# Patient Record
Sex: Male | Born: 1970 | Race: White | Hispanic: No | Marital: Married | Smoking: Never smoker
Health system: Southern US, Community
[De-identification: ages and names within clinical notes are randomized; demographics above are authoritative.]

## PROBLEM LIST (undated history)

## (undated) DIAGNOSIS — M47816 Spondylosis without myelopathy or radiculopathy, lumbar region: Secondary | ICD-10-CM

## (undated) DIAGNOSIS — R112 Nausea with vomiting, unspecified: Secondary | ICD-10-CM

## (undated) DIAGNOSIS — M51369 Other intervertebral disc degeneration, lumbar region without mention of lumbar back pain or lower extremity pain: Secondary | ICD-10-CM

## (undated) DIAGNOSIS — R000 Tachycardia, unspecified: Secondary | ICD-10-CM

## (undated) DIAGNOSIS — M5126 Other intervertebral disc displacement, lumbar region: Secondary | ICD-10-CM

## (undated) DIAGNOSIS — Z9889 Other specified postprocedural states: Secondary | ICD-10-CM

## (undated) DIAGNOSIS — Z87442 Personal history of urinary calculi: Secondary | ICD-10-CM

## (undated) HISTORY — PX: KNEE ARTHROSCOPY: SHX127

## (undated) HISTORY — PX: CYSTOSCOPY: SHX5120

---

## 2013-02-05 ENCOUNTER — Emergency Department (HOSPITAL_BASED_OUTPATIENT_CLINIC_OR_DEPARTMENT_OTHER)
Admission: EM | Admit: 2013-02-05 | Discharge: 2013-02-05 | Disposition: A | Discharge status: Home / Self Care | Payer: PRIVATE HEALTH INSURANCE | Attending: Emergency Medicine | Admitting: Emergency Medicine

## 2013-02-05 ENCOUNTER — Encounter (HOSPITAL_BASED_OUTPATIENT_CLINIC_OR_DEPARTMENT_OTHER): Payer: Self-pay

## 2013-02-05 DIAGNOSIS — R531 Weakness: Secondary | ICD-10-CM

## 2013-02-05 DIAGNOSIS — R5381 Other malaise: Secondary | ICD-10-CM | POA: Insufficient documentation

## 2013-02-05 DIAGNOSIS — Z8679 Personal history of other diseases of the circulatory system: Secondary | ICD-10-CM | POA: Insufficient documentation

## 2013-02-05 HISTORY — DX: Tachycardia, unspecified: R00.0

## 2013-02-05 LAB — BASIC METABOLIC PANEL
CO2: 27 mEq/L (ref 19–32)
Calcium: 9.8 mg/dL (ref 8.4–10.5)
Chloride: 104 mEq/L (ref 96–112)
Glucose, Bld: 102 mg/dL — ABNORMAL HIGH (ref 70–99)
Sodium: 141 mEq/L (ref 135–145)

## 2013-02-05 LAB — CBC WITH DIFFERENTIAL/PLATELET
Eosinophils Relative: 3 % (ref 0–5)
HCT: 42.9 % (ref 39.0–52.0)
Lymphocytes Relative: 31 % (ref 12–46)
Lymphs Abs: 1.8 10*3/uL (ref 0.7–4.0)
MCV: 85.3 fL (ref 78.0–100.0)
Neutro Abs: 3.3 10*3/uL (ref 1.7–7.7)
Platelets: 179 10*3/uL (ref 150–400)
RBC: 5.03 MIL/uL (ref 4.22–5.81)
WBC: 5.8 10*3/uL (ref 4.0–10.5)

## 2013-02-05 NOTE — ED Provider Notes (Signed)
History     CSN: [id number]  Arrival date & time 02/05/13  1221   First MD Initiated Contact with Patient 02/05/13 1345      Chief Complaint  Patient presents with  . Weakness    (Consider location/radiation/quality/duration/timing/severity/associated sxs/prior treatment) Patient is a 42 y.o. male presenting with weakness.  Weakness   Pt reports he was sitting in church earlier today when he had an episode that is difficult for him to describe. He says it felt like he'd been hit by lightening. He describes it as similar to the sensation of jerking awake just as he is falling asleep, although he is pretty sure he did not fall asleep. Immediately afterward he felt jittery and nervous but denies any CP, SOB, loss of consciousness, nausea or diaphoresis. He walked out of the church and checked his pulse but says it was normal. He has felt generally weak since then but no further episodes of same. Reports a stressful and tiring day yesterday.   Past Medical History  Diagnosis Date  . Sinus tachycardia     Past Surgical History  Procedure Laterality Date  . Knee arthroscopy      History reviewed. No pertinent family history.  History  Substance Use Topics  . Smoking status: Never Smoker   . Smokeless tobacco: Never Used  . Alcohol Use: No      Review of Systems  Neurological: Positive for weakness.   All other systems reviewed and are negative except as noted in HPI.   Allergies  Review of patient's allergies indicates no known allergies.  Home Medications   Current Outpatient Rx  Name  Route  Sig  Dispense  Refill  . ibuprofen (ADVIL,MOTRIN) 200 MG tablet   Oral   Take 400 mg by mouth 2 (two) times daily as needed for pain.           BP 102/62  Pulse 74  Temp(Src) 98.7 F (37.1 C) (Oral)  Resp 14  Ht 6' (1.829 m)  Wt 205 lb (92.987 kg)  BMI 27.8 kg/m2  SpO2 100%  Physical Exam  Nursing note and vitals reviewed. Constitutional: He is oriented to  person, place, and time. He appears well-developed and well-nourished.  HENT:  Head: Normocephalic and atraumatic.  Eyes: EOM are normal. Pupils are equal, round, and reactive to light.  Neck: Normal range of motion. Neck supple.  Cardiovascular: Normal rate, normal heart sounds and intact distal pulses.   Pulmonary/Chest: Effort normal and breath sounds normal.  Abdominal: Bowel sounds are normal. He exhibits no distension. There is no tenderness.  Musculoskeletal: Normal range of motion. He exhibits no edema and no tenderness.  Neurological: He is alert and oriented to person, place, and time. He has normal strength. No cranial nerve deficit or sensory deficit.  Skin: Skin is warm and dry. No rash noted.  Psychiatric: He has a normal mood and affect.    ED Course  Procedures (including critical care time)  Labs Reviewed  BASIC METABOLIC PANEL - Abnormal; Notable for the following:    Glucose, Bld 102 (*)    All other components within normal limits  CBC WITH DIFFERENTIAL  TROPONIN I   No results found.   1. General weakness       MDM   Date: 02/05/2013  Rate: 83  Rhythm: normal sinus rhythm  QRS Axis: normal  Intervals: normal  ST/T Wave abnormalities: normal  Conduction Disutrbances:none  Narrative Interpretation: inferior Q waves are not pathologic  Old EKG Reviewed: none available   Labs and EKG negative. Pt asymptomatic in the ED. Does not appear to be acute cardiac or pulmonary process. Plan discharge with PCP followup.       Lidie Glade B. Bernette Mayers, MD 02/05/13 [phone]

## 2013-02-05 NOTE — ED Notes (Addendum)
Pt states that while sitting in church he felt a sensation as if a bolt of lightning hit him.  Pt states that he has hx of sinus tachycardia.  EKG being performed.

## 2013-03-27 ENCOUNTER — Encounter (HOSPITAL_BASED_OUTPATIENT_CLINIC_OR_DEPARTMENT_OTHER): Payer: Self-pay | Admitting: Emergency Medicine

## 2013-03-27 ENCOUNTER — Emergency Department (HOSPITAL_BASED_OUTPATIENT_CLINIC_OR_DEPARTMENT_OTHER)
Admission: EM | Admit: 2013-03-27 | Discharge: 2013-03-27 | Disposition: A | Discharge status: Home / Self Care | Payer: PRIVATE HEALTH INSURANCE | Attending: Emergency Medicine | Admitting: Emergency Medicine

## 2013-03-27 DIAGNOSIS — M5380 Other specified dorsopathies, site unspecified: Secondary | ICD-10-CM | POA: Insufficient documentation

## 2013-03-27 DIAGNOSIS — R109 Unspecified abdominal pain: Secondary | ICD-10-CM | POA: Insufficient documentation

## 2013-03-27 DIAGNOSIS — M62830 Muscle spasm of back: Secondary | ICD-10-CM

## 2013-03-27 DIAGNOSIS — Z8679 Personal history of other diseases of the circulatory system: Secondary | ICD-10-CM | POA: Insufficient documentation

## 2013-03-27 MED ORDER — CYCLOBENZAPRINE HCL 5 MG PO TABS: 5.0000 mg | ORAL_TABLET | Freq: Three times a day (TID) | ORAL | Status: AC | PRN

## 2013-03-27 NOTE — ED Notes (Signed)
Sudden onset of right flank/back pain while reaching out to grab his dog's collar.  Pain does not radiate.  Pain described as "a tear" and he can't get comfortable.  He applied an ice pack immediately with some relief.  He c/o feeling nauseated and dizzy.  He took 2 Tylenol PTA.

## 2013-03-31 NOTE — ED Provider Notes (Signed)
History     CSN: [id number] Arrival date & time 03/27/13  8657 First MD Initiated Contact with Patient 03/27/13 [phone]     Chief Complaint  Patient presents with  . Back Pain  . Flank Pain   Patient is a 42 y.o. male presenting with back pain and flank pain.  Back Pain Flank Pain  Patient with a history of low back pain which he has seen his PCP, Dr. Azucena Cecil, for in the past.   He was lifting a lot as he was moving over the weekend. He said that he tried to lift as much as possible with is legs and that his low back did not bother him a great deal. This morning he reached out to grab his dog's collar and felt his about the lower 50% of his right back "freeze up". He says it all became painful up to 10/10. Felt like a ripping pain at first now just a stiff ache throughout his the right side. Has never happened before. Tried ice, pain now 8/10. No other treatments-came straight to ED. Has used flexeril in the past for low back pain with good relief.   ROS-History negative for trauma, history of cancer, fever, chills, weight loss, recent bacterial infection, recent IV drug use, HIV, pain worse at night or while supine, saddle anesthesia, bladder issues, fecal incontinence,  focal neurological deficits including weakness, radiation into legs or numbness/tingling into legs. No midline tenderness.   Past Medical History  Diagnosis Date  . Sinus tachycardia   Low back pain  Past Surgical History  Procedure Laterality Date  . Knee arthroscopy     No family history on file.  History  Substance Use Topics  . Smoking status: Never Smoker   . Smokeless tobacco: Never Used  . Alcohol Use: No  Runs at least a mile a day  Review of Systems  Genitourinary: Positive for flank pain.  Musculoskeletal: Positive for back pain.   A full 10 point review of symptoms was performed and was negative except as noted in HPI.   Allergies  Review of patient's allergies indicates no known allergies.  Home  Medications   Current Outpatient Rx  Name  Route  Sig  Dispense  Refill  . cyclobenzaprine (FLEXERIL) 5 MG tablet   Oral   Take 1 tablet (5 mg total) by mouth 3 (three) times daily as needed for muscle spasms.   30 tablet   0   . ibuprofen (ADVIL,MOTRIN) 200 MG tablet   Oral   Take 400 mg by mouth 2 (two) times daily as needed for pain.           BP 123/69  Pulse 98  Resp 16  Ht 6' (1.829 m)  Wt 202 lb (91.627 kg)  BMI 27.39 kg/m2  SpO2 100%  Physical Exam  Constitutional: He is oriented to person, place, and time. He appears well-developed and well-nourished. He appears distressed (appears slightly uncomfortable, hand on right lower back).  HENT:  Head: Normocephalic and atraumatic.  Mouth/Throat: No oropharyngeal exudate.  Eyes: EOM are normal. Pupils are equal, round, and reactive to light.  Neck: Normal range of motion. Neck supple.  Cardiovascular: Normal rate and regular rhythm.  Exam reveals no gallop and no friction rub.   No murmur heard. Pulmonary/Chest: Effort normal and breath sounds normal. He has no wheezes. He has no rales.  Abdominal: Soft. Bowel sounds are normal. There is no tenderness. There is no rebound and no guarding.  Musculoskeletal:  Normal range of motion. He exhibits no edema.   Back - Normal skin, Spine with normal alignment and no deformity.  No tenderness to vertebral process palpation.  Paraspinous muscles are tender and with spasm 50% up the back on right side.   Range of motion is full at neck but limited due to pain at lumbar sacral regions. Intact distal sensation. Negative straight leg raise.   Neurological: He is alert and oriented to person, place, and time. No cranial nerve deficit. He exhibits normal muscle tone. Coordination normal.  5/5 strength in upper and lower extremities.   Skin: Skin is warm and dry.  Psychiatric: He has a normal mood and affect. His behavior is normal.    ED Course  Procedures (including critical care  time)  Labs Reviewed - No data to display No results found.  1. Back spasm    MDM  Flexeril given. No signs of disc pathology or infection. Likely overdid it this weekend working then just got his back into the wrong position when reaching out for dog collar. Close follow up with PCP this week to ensure improving and see if needs additional pain control over ibuprofen.     Shelva Majestic, MD 03/31/13 [phone]

## 2013-04-01 NOTE — ED Provider Notes (Signed)
I saw and evaluated the patient, reviewed the resident's note and I agree with the findings and plan.   .Face to face Exam:  General:  Awake HEENT:  Atraumatic Resp:  Normal effort Abd:  Nondistended Neuro:No focal weakness   Sameka Bagent L Daymian Lill, MD 04/01/13 0704 

## 2013-11-26 ENCOUNTER — Encounter (HOSPITAL_BASED_OUTPATIENT_CLINIC_OR_DEPARTMENT_OTHER): Payer: Self-pay | Admitting: Emergency Medicine

## 2013-11-26 ENCOUNTER — Emergency Department (HOSPITAL_BASED_OUTPATIENT_CLINIC_OR_DEPARTMENT_OTHER): Payer: PRIVATE HEALTH INSURANCE

## 2013-11-26 ENCOUNTER — Emergency Department (HOSPITAL_BASED_OUTPATIENT_CLINIC_OR_DEPARTMENT_OTHER)
Admission: EM | Admit: 2013-11-26 | Discharge: 2013-11-27 | Disposition: A | Discharge status: Home / Self Care | Payer: PRIVATE HEALTH INSURANCE | Attending: Emergency Medicine | Admitting: Emergency Medicine

## 2013-11-26 DIAGNOSIS — Z8679 Personal history of other diseases of the circulatory system: Secondary | ICD-10-CM | POA: Insufficient documentation

## 2013-11-26 DIAGNOSIS — W268XXA Contact with other sharp object(s), not elsewhere classified, initial encounter: Secondary | ICD-10-CM | POA: Insufficient documentation

## 2013-11-26 DIAGNOSIS — Y929 Unspecified place or not applicable: Secondary | ICD-10-CM | POA: Insufficient documentation

## 2013-11-26 DIAGNOSIS — S91109A Unspecified open wound of unspecified toe(s) without damage to nail, initial encounter: Secondary | ICD-10-CM | POA: Insufficient documentation

## 2013-11-26 DIAGNOSIS — Y9301 Activity, walking, marching and hiking: Secondary | ICD-10-CM | POA: Insufficient documentation

## 2013-11-26 DIAGNOSIS — Z23 Encounter for immunization: Secondary | ICD-10-CM | POA: Insufficient documentation

## 2013-11-26 DIAGNOSIS — Z1833 Retained wood fragments: Secondary | ICD-10-CM

## 2013-11-26 MED ORDER — CEPHALEXIN 250 MG PO CAPS
500.0000 mg | ORAL_CAPSULE | Freq: Once | ORAL | Status: AC
  Administered 2013-11-27: 500 mg via ORAL
  Filled 2013-11-26: qty 2

## 2013-11-26 MED ORDER — TETANUS-DIPHTH-ACELL PERTUSSIS 5-2.5-18.5 LF-MCG/0.5 IM SUSP
0.5000 mL | Freq: Once | INTRAMUSCULAR | Status: AC
  Administered 2013-11-27: 0.5 mL via INTRAMUSCULAR
  Filled 2013-11-26: qty 0.5

## 2013-11-26 NOTE — ED Notes (Signed)
Pt has splinter from wooden floor in bottom of left foot

## 2013-11-26 NOTE — ED Provider Notes (Signed)
CSN: [id number]     Arrival date & time 11/26/13  1923 History   First MD Initiated Contact with Patient 11/26/13 2157     Chief Complaint  Patient presents with  . Foreign Body in Skin     (Consider location/radiation/quality/duration/timing/severity/associated sxs/prior Treatment) Patient is a 43 y.o. male presenting with foreign body.  Foreign Body Location:  Skin Suspected object:  Wood Pain quality:  Aching Pain severity:  Moderate Timing:  Constant Chronicity:  New Ineffective treatments:  None tried [name] Snyder is a 43 y.o. male who presents to the ED with a splinter to the left foot. He was walking in his socks on his hard wood floor and got a splinter in his foot. He measured the area where the splinter came up and it measured about 2 inches.    Past Medical History  Diagnosis Date  . Sinus tachycardia    Past Surgical History  Procedure Laterality Date  . Knee arthroscopy     No family history on file. History  Substance Use Topics  . Smoking status: Never Smoker   . Smokeless tobacco: Never Used  . Alcohol Use: No    Review of Systems  Negative except as stated in HPI  Allergies  Review of patient's allergies indicates no known allergies.  Home Medications   Current Outpatient Rx  Name  Route  Sig  Dispense  Refill  . ibuprofen (ADVIL,MOTRIN) 200 MG tablet   Oral   Take 400 mg by mouth 2 (two) times daily as needed for pain.         . cyclobenzaprine (FLEXERIL) 5 MG tablet   Oral   Take 1 tablet (5 mg total) by mouth 3 (three) times daily as needed for muscle spasms.   30 tablet   0    BP 124/89  Pulse 84  Temp(Src) 98.2 F (36.8 C) (Oral)  Resp 18  Ht 6' (1.829 [name])  Wt 203 lb (92.08 kg)  BMI 27.53 kg/m2  SpO2 100% Physical Exam  Nursing note and vitals reviewed. Constitutional: He is oriented to person, place, and time. He appears well-developed and well-nourished. No distress.  HENT:  Head: Normocephalic.  Eyes: EOM are normal.   Neck: Neck supple.  Cardiovascular: Normal rate.   Pulmonary/Chest: Effort normal.  Musculoskeletal: Normal range of motion.       Left foot: He exhibits tenderness. He exhibits normal range of motion, no deformity and no laceration.  There is a puncture site noted to the plantar aspect of the left foot at the base of the 4th toe. There is a small piece of wood noted coming out of the wound.   Neurological: He is alert and oriented to person, place, and time. No cranial nerve deficit.  Skin: Skin is warm and dry.  Psychiatric: He has a normal mood and affect. His behavior is normal.    ED Course  Procedures  Left foot plantar aspect  Cleaned with betadine  Injected with lidocaine 2 % without epinephrine  Small incision made above and below the puncture site.  Using a hemostat I grasped the wood splinter and pulled. Only a small piece of the wood came out before it splintered. Attempted again with the hemostat to grasp additional FB without success.   X-ray of the foot does not show the wood splinter  Wound cleaned again with antibacterial soap and dressing applied.   I discussed with the patient that after this attempt it will be better for him  to see an orthopedic surgeon. Patient concerned and wants to speak with the MD. Dr. Nicanor AlconPalumbo in to speak with the patient. Explained that there are several tendons and structures in the foot that we want to avoid causing injury to and it would be better to have a surgeon to do further surgery for the removal.   MDM  43 y.o. male with wood splinter to plantar aspect of the left foot. Unsuccessful attempt to remove entire splinter. Discussed with the patient in detail need for follow up with orthopedic surgeon for further evaluation. He is a patient of Dr. Eulah PontMurphy and will call the office in the morning for follow up. First doses of antibiotics given her in the ED.  I have reviewed this patient's vital signs, nurses notes, appropriate imaging  and discussed findings with the patient. He voices understanding.       [address][name] Alipio MartinM Chavis Snyder, [place]NP 11/27/13 [phone]

## 2013-11-27 MED ORDER — CEPHALEXIN 500 MG PO CAPS: 500.0000 mg | ORAL_CAPSULE | Freq: Three times a day (TID) | ORAL | Status: DC

## 2013-11-27 MED ORDER — SULFAMETHOXAZOLE-TMP DS 800-160 MG PO TABS
1.0000 | ORAL_TABLET | Freq: Once | ORAL | Status: AC
  Administered 2013-11-27: 1 via ORAL
  Filled 2013-11-27: qty 1

## 2013-11-27 MED ORDER — SULFAMETHOXAZOLE-TRIMETHOPRIM 800-160 MG PO TABS: 1.0000 | ORAL_TABLET | Freq: Two times a day (BID) | ORAL | Status: AC

## 2013-11-27 MED ORDER — HYDROCODONE-ACETAMINOPHEN 5-325 MG PO TABS: 1.0000 | ORAL_TABLET | Freq: Four times a day (QID) | ORAL | Status: AC | PRN

## 2013-11-27 NOTE — Discharge Instructions (Signed)
We cleaned the area surrounding the splinter with betadine and injected lidocaine to the surrounding area. A small incision was made and on each side of the splinter. Using a hemostat the end of the splinter was grasp and pulled. Only a small pice of the splinter was removed. Attempt was made to go slightly deeper to try and find the splinter but attempt was unsuccessful. We did not continue to probe because there are tendons that could be injured. Since the splinter broke off it will be necessary for you to follow up with a foot doctor or an orthopedic doctor. We are giving the information for both. Call tomorrow for an appointment. Take your antibiotics as directed. Do not take the narcotic pain medication if you are driving as it will make you sleepy.

## 2013-11-28 ENCOUNTER — Encounter (HOSPITAL_BASED_OUTPATIENT_CLINIC_OR_DEPARTMENT_OTHER): Payer: PRIVATE HEALTH INSURANCE | Admitting: Anesthesiology

## 2013-11-28 ENCOUNTER — Encounter (HOSPITAL_BASED_OUTPATIENT_CLINIC_OR_DEPARTMENT_OTHER): Payer: Self-pay | Admitting: *Deleted

## 2013-11-28 ENCOUNTER — Encounter (HOSPITAL_BASED_OUTPATIENT_CLINIC_OR_DEPARTMENT_OTHER)
Admission: RE | Disposition: A | Discharge status: Home / Self Care | Payer: Self-pay | Source: Ambulatory Visit | Attending: Orthopedic Surgery

## 2013-11-28 ENCOUNTER — Ambulatory Visit (HOSPITAL_BASED_OUTPATIENT_CLINIC_OR_DEPARTMENT_OTHER): Payer: PRIVATE HEALTH INSURANCE | Admitting: Anesthesiology

## 2013-11-28 ENCOUNTER — Ambulatory Visit (HOSPITAL_BASED_OUTPATIENT_CLINIC_OR_DEPARTMENT_OTHER)
Admission: RE | Admit: 2013-11-28 | Discharge: 2013-11-28 | Disposition: A | Discharge status: Home / Self Care | Payer: PRIVATE HEALTH INSURANCE | Source: Ambulatory Visit | Attending: Orthopedic Surgery | Admitting: Orthopedic Surgery

## 2013-11-28 ENCOUNTER — Other Ambulatory Visit: Payer: Self-pay | Admitting: Physician Assistant

## 2013-11-28 DIAGNOSIS — IMO0002 Reserved for concepts with insufficient information to code with codable children: Secondary | ICD-10-CM | POA: Insufficient documentation

## 2013-11-28 DIAGNOSIS — S90852A Superficial foreign body, left foot, initial encounter: Secondary | ICD-10-CM

## 2013-11-28 DIAGNOSIS — X58XXXA Exposure to other specified factors, initial encounter: Secondary | ICD-10-CM | POA: Insufficient documentation

## 2013-11-28 HISTORY — DX: Nausea with vomiting, unspecified: R11.2

## 2013-11-28 HISTORY — PX: I & D EXTREMITY: SHX5045

## 2013-11-28 HISTORY — DX: Spondylosis without myelopathy or radiculopathy, lumbar region: M47.816

## 2013-11-28 HISTORY — DX: Other specified postprocedural states: Z98.890

## 2013-11-28 LAB — POCT HEMOGLOBIN-HEMACUE: HEMOGLOBIN: 15.8 g/dL (ref 13.0–17.0)

## 2013-11-28 SURGERY — IRRIGATION AND DEBRIDEMENT EXTREMITY
Anesthesia: General | Laterality: Left

## 2013-11-28 MED ORDER — BUPIVACAINE HCL 0.5 % IJ SOLN
INTRAMUSCULAR | Status: DC | PRN
  Administered 2013-11-28: 6 mL

## 2013-11-28 MED ORDER — OXYCODONE HCL 5 MG PO TABS: 5.0000 mg | ORAL_TABLET | Freq: Once | ORAL | Status: DC | PRN

## 2013-11-28 MED ORDER — MIDAZOLAM HCL 2 MG/2ML IJ SOLN
1.0000 mg | INTRAMUSCULAR | Status: DC | PRN
Start: 2013-11-28 — End: 2013-11-28

## 2013-11-28 MED ORDER — LACTATED RINGERS IV SOLN: INTRAVENOUS | Status: DC

## 2013-11-28 MED ORDER — ONDANSETRON HCL 4 MG/2ML IJ SOLN
INTRAMUSCULAR | Status: DC | PRN
  Administered 2013-11-28: 4 mg via INTRAVENOUS

## 2013-11-28 MED ORDER — CHLORHEXIDINE GLUCONATE 4 % EX LIQD: 60.0000 mL | Freq: Once | CUTANEOUS | Status: DC

## 2013-11-28 MED ORDER — FENTANYL CITRATE 0.05 MG/ML IJ SOLN
INTRAMUSCULAR | Status: AC
  Filled 2013-11-28: qty 6

## 2013-11-28 MED ORDER — MIDAZOLAM HCL 2 MG/2ML IJ SOLN
INTRAMUSCULAR | Status: AC
  Filled 2013-11-28: qty 2

## 2013-11-28 MED ORDER — FENTANYL CITRATE 0.05 MG/ML IJ SOLN: 50.0000 ug | INTRAMUSCULAR | Status: DC | PRN

## 2013-11-28 MED ORDER — OXYCODONE HCL 5 MG/5ML PO SOLN: 5.0000 mg | Freq: Once | ORAL | Status: DC | PRN

## 2013-11-28 MED ORDER — BUPIVACAINE HCL (PF) 0.5 % IJ SOLN
INTRAMUSCULAR | Status: AC
  Filled 2013-11-28: qty 30

## 2013-11-28 MED ORDER — FENTANYL CITRATE 0.05 MG/ML IJ SOLN
INTRAMUSCULAR | Status: DC | PRN
  Administered 2013-11-28 (×3): 50 ug via INTRAVENOUS

## 2013-11-28 MED ORDER — SUCCINYLCHOLINE CHLORIDE 20 MG/ML IJ SOLN
INTRAMUSCULAR | Status: AC
  Filled 2013-11-28: qty 1

## 2013-11-28 MED ORDER — LACTATED RINGERS IV SOLN
INTRAVENOUS | Status: DC
  Administered 2013-11-28 (×2): via INTRAVENOUS

## 2013-11-28 MED ORDER — SUCCINYLCHOLINE CHLORIDE 20 MG/ML IJ SOLN
INTRAMUSCULAR | Status: DC | PRN
  Administered 2013-11-28: 100 mg via INTRAVENOUS

## 2013-11-28 MED ORDER — DEXAMETHASONE SODIUM PHOSPHATE 10 MG/ML IJ SOLN
INTRAMUSCULAR | Status: DC | PRN
  Administered 2013-11-28: 4 mg via INTRAVENOUS

## 2013-11-28 MED ORDER — CEFAZOLIN SODIUM-DEXTROSE 2-3 GM-% IV SOLR
INTRAVENOUS | Status: AC
  Filled 2013-11-28: qty 50

## 2013-11-28 MED ORDER — MIDAZOLAM HCL 5 MG/5ML IJ SOLN
INTRAMUSCULAR | Status: DC | PRN
  Administered 2013-11-28: 2 mg via INTRAVENOUS

## 2013-11-28 MED ORDER — CEFAZOLIN SODIUM-DEXTROSE 2-3 GM-% IV SOLR: 2.0000 g | INTRAVENOUS | Status: DC

## 2013-11-28 MED ORDER — METOCLOPRAMIDE HCL 5 MG/ML IJ SOLN
INTRAMUSCULAR | Status: DC | PRN
  Administered 2013-11-28: 10 mg via INTRAVENOUS

## 2013-11-28 MED ORDER — LIDOCAINE HCL (CARDIAC) 20 MG/ML IV SOLN
INTRAVENOUS | Status: DC | PRN
  Administered 2013-11-28: 60 mg via INTRAVENOUS

## 2013-11-28 MED ORDER — METOCLOPRAMIDE HCL 5 MG/ML IJ SOLN
10.0000 mg | Freq: Once | INTRAMUSCULAR | Status: DC | PRN
Start: 2013-11-28 — End: 2013-11-28

## 2013-11-28 MED ORDER — PROPOFOL 10 MG/ML IV BOLUS
INTRAVENOUS | Status: DC | PRN
  Administered 2013-11-28: 200 mg via INTRAVENOUS

## 2013-11-28 MED ORDER — HYDROMORPHONE HCL PF 1 MG/ML IJ SOLN: 0.2500 mg | INTRAMUSCULAR | Status: DC | PRN

## 2013-11-28 SURGICAL SUPPLY — 65 items
APL SKNCLS STERI-STRIP NONHPOA (GAUZE/BANDAGES/DRESSINGS)
BANDAGE ELASTIC 4 VELCRO ST LF (GAUZE/BANDAGES/DRESSINGS) IMPLANT
BANDAGE ELASTIC 6 VELCRO ST LF (GAUZE/BANDAGES/DRESSINGS) IMPLANT
BANDAGE ESMARK 6X9 LF (GAUZE/BANDAGES/DRESSINGS) ×1 IMPLANT
BENZOIN TINCTURE PRP APPL 2/3 (GAUZE/BANDAGES/DRESSINGS) IMPLANT
BLADE AVERAGE 25MMX9MM (BLADE)
BLADE AVERAGE 25X9 (BLADE) IMPLANT
BLADE OSC/SAG .038X5.5 CUT EDG (BLADE) IMPLANT
BLADE SURG 15 STRL LF DISP TIS (BLADE) ×1 IMPLANT
BLADE SURG 15 STRL SS (BLADE) ×3
BNDG CMPR 9X6 STRL LF SNTH (GAUZE/BANDAGES/DRESSINGS) ×1
BNDG COHESIVE 4X5 TAN STRL (GAUZE/BANDAGES/DRESSINGS) ×3 IMPLANT
BNDG ESMARK 6X9 LF (GAUZE/BANDAGES/DRESSINGS) ×3
CANISTER SUCT 1200ML W/VALVE (MISCELLANEOUS) IMPLANT
CLOSURE WOUND 1/2 X4 (GAUZE/BANDAGES/DRESSINGS) ×1
COVER TABLE BACK 60X90 (DRAPES) ×3 IMPLANT
CUFF TOURNIQUET SINGLE 18IN (TOURNIQUET CUFF) IMPLANT
CUFF TOURNIQUET SINGLE 24IN (TOURNIQUET CUFF) IMPLANT
DECANTER SPIKE VIAL GLASS SM (MISCELLANEOUS) IMPLANT
DRAPE EXTREMITY T 121X128X90 (DRAPE) ×3 IMPLANT
DRAPE OEC MINIVIEW 54X84 (DRAPES) IMPLANT
DRAPE U 20/CS (DRAPES) ×3 IMPLANT
DRAPE U-SHAPE 47X51 STRL (DRAPES) ×3 IMPLANT
DURAPREP 26ML APPLICATOR (WOUND CARE) ×3 IMPLANT
ELECT NDL TIP 2.8 STRL (NEEDLE) ×1 IMPLANT
ELECT NEEDLE TIP 2.8 STRL (NEEDLE) ×3 IMPLANT
ELECT REM PT RETURN 9FT ADLT (ELECTROSURGICAL) ×3
ELECTRODE REM PT RTRN 9FT ADLT (ELECTROSURGICAL) ×1 IMPLANT
GAUZE XEROFORM 1X8 LF (GAUZE/BANDAGES/DRESSINGS) ×3 IMPLANT
GLOVE BIO SURGEON STRL SZ7 (GLOVE) ×6 IMPLANT
GLOVE BIOGEL PI IND STRL 7.5 (GLOVE) ×2 IMPLANT
GLOVE BIOGEL PI INDICATOR 7.5 (GLOVE) ×4
GLOVE ECLIPSE 6.5 STRL STRAW (GLOVE) ×2 IMPLANT
GLOVE ORTHO TXT STRL SZ7.5 (GLOVE) ×6 IMPLANT
GOWN STRL REUS W/ TWL LRG LVL3 (GOWN DISPOSABLE) ×4 IMPLANT
GOWN STRL REUS W/ TWL XL LVL3 (GOWN DISPOSABLE) ×2 IMPLANT
GOWN STRL REUS W/TWL LRG LVL3 (GOWN DISPOSABLE) ×6
GOWN STRL REUS W/TWL XL LVL3 (GOWN DISPOSABLE) ×9 IMPLANT
NDL HYPO 25X1 1.5 SAFETY (NEEDLE) IMPLANT
NEEDLE HYPO 25X1 1.5 SAFETY (NEEDLE) ×3 IMPLANT
NS IRRIG 1000ML POUR BTL (IV SOLUTION) ×3 IMPLANT
PACK BASIN DAY SURGERY FS (CUSTOM PROCEDURE TRAY) ×3 IMPLANT
PAD CAST 3X4 CTTN HI CHSV (CAST SUPPLIES) IMPLANT
PAD CAST 4YDX4 CTTN HI CHSV (CAST SUPPLIES) ×2 IMPLANT
PADDING CAST COTTON 3X4 STRL (CAST SUPPLIES) ×3
PADDING CAST COTTON 4X4 STRL (CAST SUPPLIES) ×6
PENCIL BUTTON HOLSTER BLD 10FT (ELECTRODE) ×3 IMPLANT
SPONGE GAUZE 4X4 12PLY (GAUZE/BANDAGES/DRESSINGS) ×3 IMPLANT
SPONGE LAP 4X18 X RAY DECT (DISPOSABLE) IMPLANT
STOCKINETTE 4X48 STRL (DRAPES) ×3 IMPLANT
STRIP CLOSURE SKIN 1/2X4 (GAUZE/BANDAGES/DRESSINGS) ×2 IMPLANT
SUCTION FRAZIER TIP 10 FR DISP (SUCTIONS) IMPLANT
SUT ETHIBOND 2 OS 4 DA (SUTURE) IMPLANT
SUT ETHILON 3 0 PS 1 (SUTURE) ×3 IMPLANT
SUT VIC AB 2-0 SH 27 (SUTURE)
SUT VIC AB 2-0 SH 27XBRD (SUTURE) IMPLANT
SUT VIC AB 3-0 SH 27 (SUTURE)
SUT VIC AB 3-0 SH 27X BRD (SUTURE) IMPLANT
SUT VICRYL 4-0 PS2 18IN ABS (SUTURE) IMPLANT
SYR BULB 3OZ (MISCELLANEOUS) ×3 IMPLANT
SYR CONTROL 10ML LL (SYRINGE) ×3 IMPLANT
TUBE CONNECTING 20'X1/4 (TUBING)
TUBE CONNECTING 20X1/4 (TUBING) IMPLANT
UNDERPAD 30X30 INCONTINENT (UNDERPADS AND DIAPERS) ×3 IMPLANT
YANKAUER SUCT BULB TIP NO VENT (SUCTIONS) IMPLANT

## 2013-11-28 NOTE — Anesthesia Preprocedure Evaluation (Signed)
Anesthesia Evaluation  Patient identified by MRN, date of birth, ID band Patient awake    Reviewed: Allergy & Precautions, H&P , NPO status , Patient's Chart, lab work & pertinent test results, reviewed documented beta blocker date and time   History of Anesthesia Complications (+) PONV and history of anesthetic complications  Airway Mallampati: II TM Distance: >3 FB Neck ROM: full    Dental   Pulmonary neg pulmonary ROS,  breath sounds clear to auscultation        Cardiovascular negative cardio ROS  Rhythm:regular     Neuro/Psych negative neurological ROS  negative psych ROS   GI/Hepatic negative GI ROS, Neg liver ROS,   Endo/Other  negative endocrine ROS  Renal/GU negative Renal ROS  negative genitourinary   Musculoskeletal   Abdominal   Peds  Hematology negative hematology ROS (+)   Anesthesia Other Findings See surgeon's H&P   Reproductive/Obstetrics negative OB ROS                           Anesthesia Physical Anesthesia Plan  ASA: I  Anesthesia Plan: General   Post-op Pain Management:    Induction: Intravenous  Airway Management Planned: Oral ETT  Additional Equipment:   Intra-op Plan:   Post-operative Plan: Extubation in OR  Informed Consent: I have reviewed the patients History and Physical, chart, labs and discussed the procedure including the risks, benefits and alternatives for the proposed anesthesia with the patient or authorized representative who has indicated his/her understanding and acceptance.   Dental Advisory Given  Plan Discussed with: CRNA and Surgeon  Anesthesia Plan Comments:         Anesthesia Quick Evaluation

## 2013-11-28 NOTE — ED Provider Notes (Signed)
Medical screening examination/treatment/procedure(s) were performed by non-physician practitioner and as supervising physician I was immediately available for consultation/collaboration.  EKG Interpretation   None         Ryane Konieczny W. Purvi Ruehl, MD 11/28/13 0728 

## 2013-11-28 NOTE — H&P (View-Only) (Signed)
[facility] [address]   [address] [place],  [address] [phone] A Division of [facility]  Daniel F. Murphy, M.D.   Robert A. Wainer, M.D.   W. Dan Caffrey, M.D.   Joshua P. Landau, M.D.   Anna Voytek, M.D. Timothy D. Murphy, M.D.  [place] R. Ibazebo, M.D.  James S. Kramer, M.D.    Rebecca S. Bassett, M.D. Mary L. Anton, PA-C  Kirstin A. Shepperson, PA-C  Josh Chadwell, PA-C Brandon Parry, OPA-C  RE: [name] Snyder, [name] Snyder   [id number]      DOB: 08/19/1971 PREOP HISTORY AND PHYSICAL: 11-28-13  CHIEF COMPLAINT/HPI:  43-year-old male comes in today complaining of left foot pain for the past two days.  He states that this past Sunday he was walking barefoot on hardwood floors shuffling his feet and stepped on a piece of wood and got a splinter deep within his foot.  The pain is having is aggravated while walking on the ball of foot, and relieved with elevation and Advil.  He denies any fevers, chills, or drainage.  He states that day he did go to the ER when they tried to get the splinter out but were not successful.  He was given a tetanus shot and antibiotics which he is currently on.    Current medications: Keflex 500 mg. t.i.d., Bactrim 800/160 b.i.d. Allergies: no known drug allergies.   Past Medical History is significant for sinus tachycardia. Past Surgical History is significant for left knee arthroscopy x 2. Family History is significant for diabetes and heart attack in his maternal grandfather.  Heart attack in his mother.  Brain and lung cancer in his maternal grandmother. Social History:  He is married and lives with his wife.  He denies any smoking or alcohol use. Review of Systems: he denies any lightheadedness, dizziness, fevers, chills, chest pain, pressure, palpitations, shortness of breath or any other pulmonary, GI, GU or neuro issues.    EXAMINATION: Ht. 5'11", Wt. 203 pounds, T 98.3, blood pressure 123/76, P  94 and O2 sat. 97% on room air.  He is alert and oriented x 3 in no acute distress.  He does have an antalgic gait on the left.  Head is normocephalic and atraumatic, PERRLA and EOM's intact.  Neck unremarkable.  Lungs: clear to auscultation bilaterally. No wheezes, rales or rhonchi.  Heart:  regular rate and rhythm and no murmurs.  Abdomen is soft, nontender with normoactive bowel sounds x 4.  Calves are nontender bilaterally.  Neuro: grossly intact bilateral upper and lower extremities.  Skin is warm and dry.  Examination of his left foot reveals open wound in the central aspect of his foot.  No drainage noted, no erythema or edema is noted.  He does have good range of motion about his left foot and all 5 toes.     X-RAYS: Left foot 3-view reveal a probable foreign body to the plantar aspect of his left foot.    IMPRESSION: 2 inch splinter located on the plantar aspect of his left foot.   PLAN: Today we will proceed with irrigation and debridement of his left foot.  We discussed risks, benefits and possible complications of surgery.  Rehab and recovery time discussed.  All questions were answered.  He will not need any clearance for this.      Daniel F. Murphy, M.D.  Electronically verified by Daniel F. Murphy, M.D. DFM(LA):gde D 11-28-13 T 11-28-13  

## 2013-11-28 NOTE — Op Note (Deleted)
NAME:  Gielow, Capone                  ACCOUNT NO.:  [id number]  MEDICAL RECORD NO.:  [id number]  LOCATION:                               FACILITY:  [facility]  PHYSICIAN:  Giulietta Prokop F. Harmoney Sienkiewicz, M.D. DATE OF BIRTH:  01/23/1971  DATE OF PROCEDURE:  11/28/2013 DATE OF DISCHARGE:  11/28/2013                              OPERATIVE REPORT   PREOPERATIVE DIAGNOSIS:  Penetrating large wood splinter foreign body plantar aspect left for foot between the second and third metatarsal heads.  POSTOPERATIVE DIAGNOSIS:  Penetrating large wood splinter foreign body plantar aspect left for foot between the second and third metatarsal heads with the foreign body going deep penetrating the plantar fascia. No surrounding obvious purulence.  PROCEDURE:  Left foot exploration and removal foreign body, irrigation and loose primary closure.  SURGEON:  Lilliahna Schubring F. Corley Maffeo, M.D.  ASSISTANT:  Lindsey Anton.  ANESTHESIA:  General.  BLOOD LOSS:  Minimal.  SPECIMENS:  None.  CULTURES:  None.  COMPLICATIONS:  None.  DRESSINGS:  Soft compressive.  TOURNIQUET TIME:  30 minutes.  DESCRIPTION OF PROCEDURE:  The patient was brought to the operating room, placed on operating table in supine position.  After adequate anesthesia had been obtained, tourniquet applied.  Prepped and draped in usual sterile fashion.  Exsanguinated with elevation of Esmarch. Tourniquet inflated to 250 mmHg.  A longitudinal puncture wound was opened slightly proximal and distal to allow exposure.  I then did a blunt dissection to trephine the foreign body which had grown in and traversed deep and proximal to the entrance wound.  Protecting neurovascular structures.  This was then finally found a good centimeter from the entrance site.  The end of which penetrated through the plantar fascia.  There was brought out distally and removed in its entirety. Matching the size of the splinter from the wood defect that the patient had given me a  picture of.  No other foreign material was found. The wound was copiously irrigated.  Loosely approximated with nylon. Margins were injected with Marcaine.  Sterile compressive dressing applied.  Tourniquet deflated and removed.  Anesthesia reversed. Brought to the recovery room.  Tolerated the surgery well.  No complications.     Aryiah Monterosso F. Devon Kingdon, M.D.     DFM/MEDQ  D:  11/28/2013  T:  11/28/2013  Job:  [id number] 

## 2013-11-28 NOTE — Discharge Instructions (Signed)
°  Incision and Drainage Care After Refer to this sheet in the next few weeks. These instructions provide you with information on caring for yourself after your procedure. Your caregiver may also give you more specific instructions. Your treatment has been planned according to current medical practices, but problems sometimes occur. Call your caregiver if you have any problems or questions after your procedure. HOME CARE INSTRUCTIONS  Weight bearing as tolerated in post-op shoe.  Do not change dressing until we see you at follow up appointment.  May shower, but do not soak incision.  May ice for up to 20 minutes at a time for pain and swelling.  Follow up appointment in one week.   If antibiotic medicine is given, take it as directed. Finish it even if you start to feel better.  Only take over-the-counter or prescription medicines for pain, discomfort, or fever as directed by your caregiver.  Keep all follow-up appointments as directed by your caregiver.  Change any bandages (dressings) as directed by your caregiver. Replace old dressings with clean dressings.  Wash your hands before and after caring for your wound. You will receive specific instructions for cleansing and caring for your wound.  SEEK MEDICAL CARE IF:   You have increased pain, swelling, or redness around the wound.  You have increased drainage, smell, or bleeding from the wound.  You have muscle aches, chills, or you feel generally sick.  You have a fever. MAKE SURE YOU:   Understand these instructions.  Will watch your condition.  Will get help right away if you are not doing well or get worse. Document Released: 12/21/2011 Document Reviewed: 12/21/2011 [facility] Patient Information 2014 [place], [place]. w up appointment in one week.    Post Anesthesia Home Care Instructions  Activity: Get plenty of rest for the remainder of the day. A responsible adult should stay with you for 24 hours following the  procedure.  For the next 24 hours, DO NOT: -Drive a car -Advertising copywriterperate machinery -Drink alcoholic beverages -Take any medication unless instructed by your physician -Make any legal decisions or sign important papers.  Meals: Start with liquid foods such as gelatin or soup. Progress to regular foods as tolerated. Avoid greasy, spicy, heavy foods. If nausea and/or vomiting occur, drink only clear liquids until the nausea and/or vomiting subsides. Call your physician if vomiting continues.  Special Instructions/Symptoms: Your throat may feel dry or sore from the anesthesia or the breathing tube placed in your throat during surgery. If this causes discomfort, gargle with warm salt water. The discomfort should disappear within 24 hours.

## 2013-11-28 NOTE — Op Note (Signed)
NAMVerdene Rio:  Snyder, [name]                  ACCOUNT NO.:  [id number]  MEDICAL RECORD NO.:  [id number]  LOCATION:                               FACILITY:  [facility]  PHYSICIAN:  Loreta Aveaniel F. Murphy, M.D. DATE OF BIRTH:  Apr 25, 1971  DATE OF PROCEDURE:  11/28/2013 DATE OF DISCHARGE:  11/28/2013                              OPERATIVE REPORT   PREOPERATIVE DIAGNOSIS:  Penetrating large wood splinter foreign body plantar aspect left for foot between the second and third metatarsal heads.  POSTOPERATIVE DIAGNOSIS:  Penetrating large wood splinter foreign body plantar aspect left for foot between the second and third metatarsal heads with the foreign body going deep penetrating the plantar fascia. No surrounding obvious purulence.  PROCEDURE:  Left foot exploration and removal foreign body, irrigation and loose primary closure.  SURGEON:  Loreta Aveaniel F. Murphy, M.D.  ASSISTANT:  Odelia GageLindsey Anton.  ANESTHESIA:  General.  BLOOD LOSS:  Minimal.  SPECIMENS:  None.  CULTURES:  None.  COMPLICATIONS:  None.  DRESSINGS:  Soft compressive.  TOURNIQUET TIME:  30 minutes.  DESCRIPTION OF PROCEDURE:  The patient was brought to the operating room, placed on operating table in supine position.  After adequate anesthesia had been obtained, tourniquet applied.  Prepped and draped in usual sterile fashion.  Exsanguinated with elevation of Esmarch. Tourniquet inflated to 250 mmHg.  A longitudinal puncture wound was opened slightly proximal and distal to allow exposure.  I then did a blunt dissection to trephine the foreign body which had grown in and traversed deep and proximal to the entrance wound.  Protecting neurovascular structures.  This was then finally found a good centimeter from the entrance site.  The end of which penetrated through the plantar fascia.  There was brought out distally and removed in its entirety. Matching the size of the splinter from the wood defect that the patient had given me a  picture of.  No other foreign material was found. The wound was copiously irrigated.  Loosely approximated with nylon. Margins were injected with Marcaine.  Sterile compressive dressing applied.  Tourniquet deflated and removed.  Anesthesia reversed. Brought to the recovery room.  Tolerated the surgery well.  No complications.     Loreta Aveaniel F. Murphy, M.D.     DFM/MEDQ  D:  11/28/2013  T:  11/28/2013  Job:  [id number]

## 2013-11-28 NOTE — Anesthesia Postprocedure Evaluation (Signed)
Anesthesia Post Note  Patient: [name] Snyder  Procedure(s) Performed: Procedure(s) (LRB): REMOVAL FOREIGN BODY LEFT FOOT (Left)  Anesthesia type: General  Patient location: PACU  Post pain: Pain level controlled  Post assessment: Patient's Cardiovascular Status Stable  Last Vitals:  Filed Vitals:   11/28/13 1415  BP: 117/58  Pulse: 89  Temp:   Resp: 17    Post vital signs: Reviewed and stable  Level of consciousness: alert  Complications: No apparent anesthesia complications

## 2013-11-28 NOTE — Anesthesia Procedure Notes (Signed)
Procedure Name: Intubation Date/Time: 11/28/2013 1:07 PM Performed by: Maelee Hoot Pre-anesthesia Checklist: Patient identified, Emergency Drugs available, Suction available and Patient being monitored Patient Re-evaluated:Patient Re-evaluated prior to inductionOxygen Delivery Method: Circle System Utilized Preoxygenation: Pre-oxygenation with 100% oxygen Intubation Type: IV induction Ventilation: Mask ventilation without difficulty Laryngoscope Size: Mac and 3 Grade View: Grade I Tube type: Oral Tube size: 7.0 mm Number of attempts: 1 Airway Equipment and Method: stylet Placement Confirmation: ETT inserted through vocal cords under direct vision,  positive ETCO2 and breath sounds checked- equal and bilateral Secured at: 22 cm Tube secured with: Tape Dental Injury: Teeth and Oropharynx as per pre-operative assessment

## 2013-11-28 NOTE — Interval H&P Note (Signed)
History and Physical Interval Note:  11/28/2013 12:48 PM  [name] Snyder  has presented today for surgery, with the diagnosis of foreign body left foot  The various methods of treatment have been discussed with the patient and family. After consideration of risks, benefits and other options for treatment, the patient has consented to  Procedure(s): REMOVAL FOREIGN BODY LEFT FOOT (Left) as a surgical intervention .  The patient's history has been reviewed, patient examined, no change in status, stable for surgery.  I have reviewed the patient's chart and labs.  Questions were answered to the patient's satisfaction.     MURPHY,DANIEL F

## 2013-11-28 NOTE — Transfer of Care (Signed)
Immediate Anesthesia Transfer of Care Note  Patient: [name] Snyder  Procedure(s) Performed: Procedure(s): REMOVAL FOREIGN BODY LEFT FOOT (Left)  Patient Location: PACU  Anesthesia Type:General  Level of Consciousness: awake, alert , oriented and patient cooperative  Airway & Oxygen Therapy: Patient Spontanous Breathing and Patient connected to face mask oxygen  Post-op Assessment: Report given to PACU RN and Post -op Vital signs reviewed and stable  Post vital signs: Reviewed and stable  Complications: No apparent anesthesia complications

## 2013-11-28 NOTE — H&P (Signed)
[facility] [address]   [address] [place], [place] [id number] [phone] A Division of [facility]  Loreta Aveaniel F. Murphy, M.D.   Robert A. Thurston HoleWainer, M.D.   Burnell BlanksW. Dan Caffrey, M.D.   Eulas PostJoshua P. Landau, M.D.   Lunette StandsAnna Voytek, M.D. Jewel Baizeimothy D. Eulah PontMurphy, M.D.  Buford DresserWesley R. Ibazebo, M.D.  Estell HarpinJames S. Kramer, M.D.    Melina Fiddlerebecca S. Bassett, M.D. Mary L. Isidoro DonningAnton, PA-C  Kirstin A. Shepperson, PA-C  Josh Firthhadwell, PA-C [place] Par[place], [place]-C  RE: [name] RioHamm, [name]   [id number]      DOB: November 22, 1970 PREOP HISTORY AND PHYSICAL: 11-28-13  CHIEF COMPLAINT/HPI:  43 year old male comes in today complaining of left foot pain for the past two days.  He states that this past Sunday he was walking barefoot on hardwood floors shuffling his feet and stepped on a piece of wood and got a splinter deep within his foot.  The pain is having is aggravated while walking on the ball of foot, and relieved with elevation and Advil.  He denies any fevers, chills, or drainage.  He states that day he did go to the ER when they tried to get the splinter out but were not successful.  He was given a tetanus shot and antibiotics which he is currently on.    Current medications: Keflex 500 mg. t.i.d., Bactrim 800/160 b.i.d. Allergies: no known drug allergies.   Past Medical History is significant for sinus tachycardia. Past Surgical History is significant for left knee arthroscopy x 2. Family History is significant for diabetes and heart attack in his maternal grandfather.  Heart attack in his mother.  Brain and lung cancer in his maternal grandmother. Social History:  He is married and lives with his wife.  He denies any smoking or alcohol use. Review of Systems: he denies any lightheadedness, dizziness, fevers, chills, chest pain, pressure, palpitations, shortness of breath or any other pulmonary, GI, GU or neuro issues.    EXAMINATION: Ht. 5'11", Wt. 203 pounds, T 98.3, blood pressure 123/76, P  94 and O2 sat. 97% on room air.  He is alert and oriented x 3 in no acute distress.  He does have an antalgic gait on the left.  Head is normocephalic and atraumatic, PERRLA and EOM's intact.  Neck unremarkable.  Lungs: clear to auscultation bilaterally. No wheezes, rales or rhonchi.  Heart:  regular rate and rhythm and no murmurs.  Abdomen is soft, nontender with normoactive bowel sounds x 4.  Calves are nontender bilaterally.  Neuro: grossly intact bilateral upper and lower extremities.  Skin is warm and dry.  Examination of his left foot reveals open wound in the central aspect of his foot.  No drainage noted, no erythema or edema is noted.  He does have good range of motion about his left foot and all 5 toes.     X-RAYS: Left foot 3-view reveal a probable foreign body to the plantar aspect of his left foot.    IMPRESSION: 2 inch splinter located on the plantar aspect of his left foot.   PLAN: Today we will proceed with irrigation and debridement of his left foot.  We discussed risks, benefits and possible complications of surgery.  Rehab and recovery time discussed.  All questions were answered.  He will not need any clearance for this.      Loreta Aveaniel F. Murphy, M.D.  Electronically verified by Loreta Aveaniel F. Murphy, M.D. DFM(LA):gde D 11-28-13 T 11-28-13

## 2013-11-29 ENCOUNTER — Encounter (HOSPITAL_BASED_OUTPATIENT_CLINIC_OR_DEPARTMENT_OTHER): Payer: Self-pay | Admitting: Orthopedic Surgery

## 2019-04-06 ENCOUNTER — Ambulatory Visit
Admission: RE | Admit: 2019-04-06 | Discharge: 2019-04-06 | Disposition: A | Discharge status: Home / Self Care | Payer: BLUE CROSS/BLUE SHIELD | Source: Ambulatory Visit | Attending: Family Medicine | Admitting: Family Medicine

## 2019-04-06 ENCOUNTER — Other Ambulatory Visit: Payer: Self-pay | Admitting: Family Medicine

## 2019-04-06 DIAGNOSIS — R310 Gross hematuria: Secondary | ICD-10-CM

## 2019-04-07 ENCOUNTER — Other Ambulatory Visit: Payer: Self-pay | Admitting: Urology

## 2019-04-07 ENCOUNTER — Encounter (HOSPITAL_COMMUNITY): Payer: Self-pay | Admitting: General Practice

## 2019-04-10 ENCOUNTER — Encounter (HOSPITAL_COMMUNITY): Payer: Self-pay | Admitting: General Practice

## 2019-04-10 ENCOUNTER — Encounter (HOSPITAL_COMMUNITY)
Admission: RE | Disposition: A | Discharge status: Home / Self Care | Payer: Self-pay | Source: Other Acute Inpatient Hospital | Attending: Urology

## 2019-04-10 ENCOUNTER — Ambulatory Visit (HOSPITAL_COMMUNITY)
Admission: RE | Admit: 2019-04-10 | Discharge: 2019-04-10 | Disposition: A | Discharge status: Home / Self Care | Payer: BLUE CROSS/BLUE SHIELD | Source: Other Acute Inpatient Hospital | Attending: Urology | Admitting: Urology

## 2019-04-10 ENCOUNTER — Ambulatory Visit (HOSPITAL_COMMUNITY): Payer: BLUE CROSS/BLUE SHIELD

## 2019-04-10 DIAGNOSIS — N201 Calculus of ureter: Secondary | ICD-10-CM

## 2019-04-10 DIAGNOSIS — N200 Calculus of kidney: Secondary | ICD-10-CM | POA: Insufficient documentation

## 2019-04-10 DIAGNOSIS — Z8051 Family history of malignant neoplasm of kidney: Secondary | ICD-10-CM | POA: Diagnosis not present

## 2019-04-10 HISTORY — DX: Personal history of urinary calculi: Z87.442

## 2019-04-10 HISTORY — DX: Other intervertebral disc degeneration, lumbar region without mention of lumbar back pain or lower extremity pain: M51.369

## 2019-04-10 HISTORY — DX: Other intervertebral disc displacement, lumbar region: M51.26

## 2019-04-10 HISTORY — PX: EXTRACORPOREAL SHOCK WAVE LITHOTRIPSY: SHX1557

## 2019-04-10 SURGERY — LITHOTRIPSY, ESWL
Anesthesia: LOCAL | Laterality: Right

## 2019-04-10 MED ORDER — SODIUM CHLORIDE 0.9 % IV SOLN
INTRAVENOUS | Status: DC
  Administered 2019-04-10: 09:00:00 via INTRAVENOUS

## 2019-04-10 MED ORDER — CIPROFLOXACIN HCL 500 MG PO TABS
500.0000 mg | ORAL_TABLET | ORAL | Status: AC
  Administered 2019-04-10: 500 mg via ORAL
  Filled 2019-04-10: qty 1

## 2019-04-10 MED ORDER — DIAZEPAM 5 MG PO TABS
10.0000 mg | ORAL_TABLET | ORAL | Status: AC
  Administered 2019-04-10: 10 mg via ORAL
  Filled 2019-04-10: qty 2

## 2019-04-10 MED ORDER — DIPHENHYDRAMINE HCL 25 MG PO CAPS
25.0000 mg | ORAL_CAPSULE | ORAL | Status: AC
  Administered 2019-04-10: 25 mg via ORAL
  Filled 2019-04-10: qty 1

## 2019-04-10 NOTE — Discharge Instructions (Signed)

## 2019-04-11 ENCOUNTER — Encounter (HOSPITAL_COMMUNITY): Payer: Self-pay | Admitting: Urology

## 2019-04-19 ENCOUNTER — Other Ambulatory Visit: Payer: Self-pay | Admitting: Family Medicine

## 2019-04-19 DIAGNOSIS — K769 Liver disease, unspecified: Secondary | ICD-10-CM

## 2019-05-18 ENCOUNTER — Other Ambulatory Visit: Payer: Self-pay | Admitting: Family Medicine

## 2019-05-22 ENCOUNTER — Other Ambulatory Visit: Payer: Self-pay

## 2019-05-22 ENCOUNTER — Ambulatory Visit
Admission: RE | Admit: 2019-05-22 | Discharge: 2019-05-22 | Disposition: A | Discharge status: Home / Self Care | Payer: BLUE CROSS/BLUE SHIELD | Source: Ambulatory Visit | Attending: Family Medicine | Admitting: Family Medicine

## 2019-05-22 DIAGNOSIS — K769 Liver disease, unspecified: Secondary | ICD-10-CM

## 2019-05-22 MED ORDER — GADOBENATE DIMEGLUMINE 529 MG/ML IV SOLN
20.0000 mL | Freq: Once | INTRAVENOUS | Status: AC | PRN
  Administered 2019-05-22: 18:00:00 20 mL via INTRAVENOUS

## 2021-04-18 IMAGING — CR ABDOMEN - 1 VIEW
2 series · 2 of 2 positions shown · non-contrast
Comparison: CT 04/06/2019

CLINICAL DATA: Right flank pain, 9 mm known stone

EXAM:
ABDOMEN - 1 VIEW

[t abdomen supine (1 of 2)]
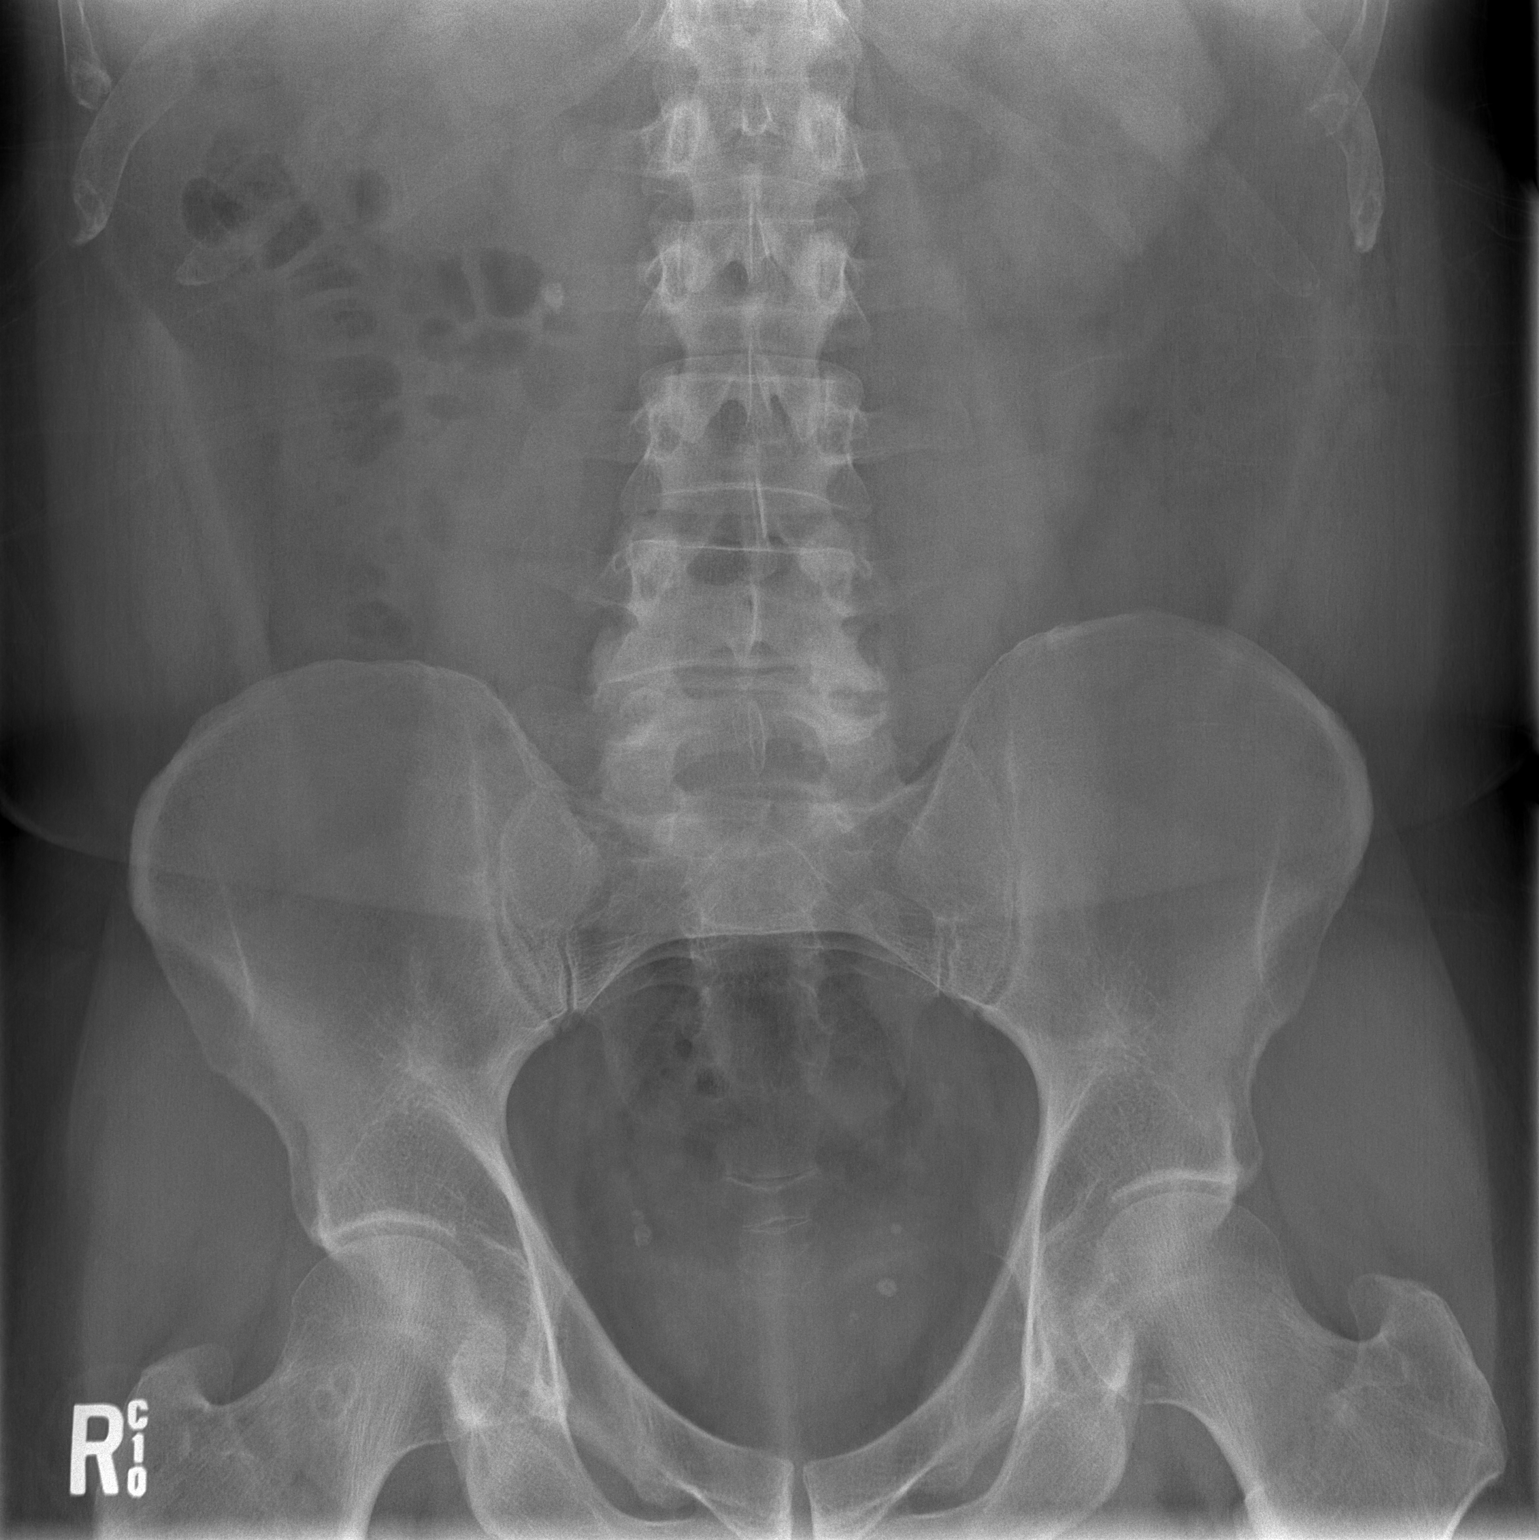

[t abdomen supine (2 of 2)]
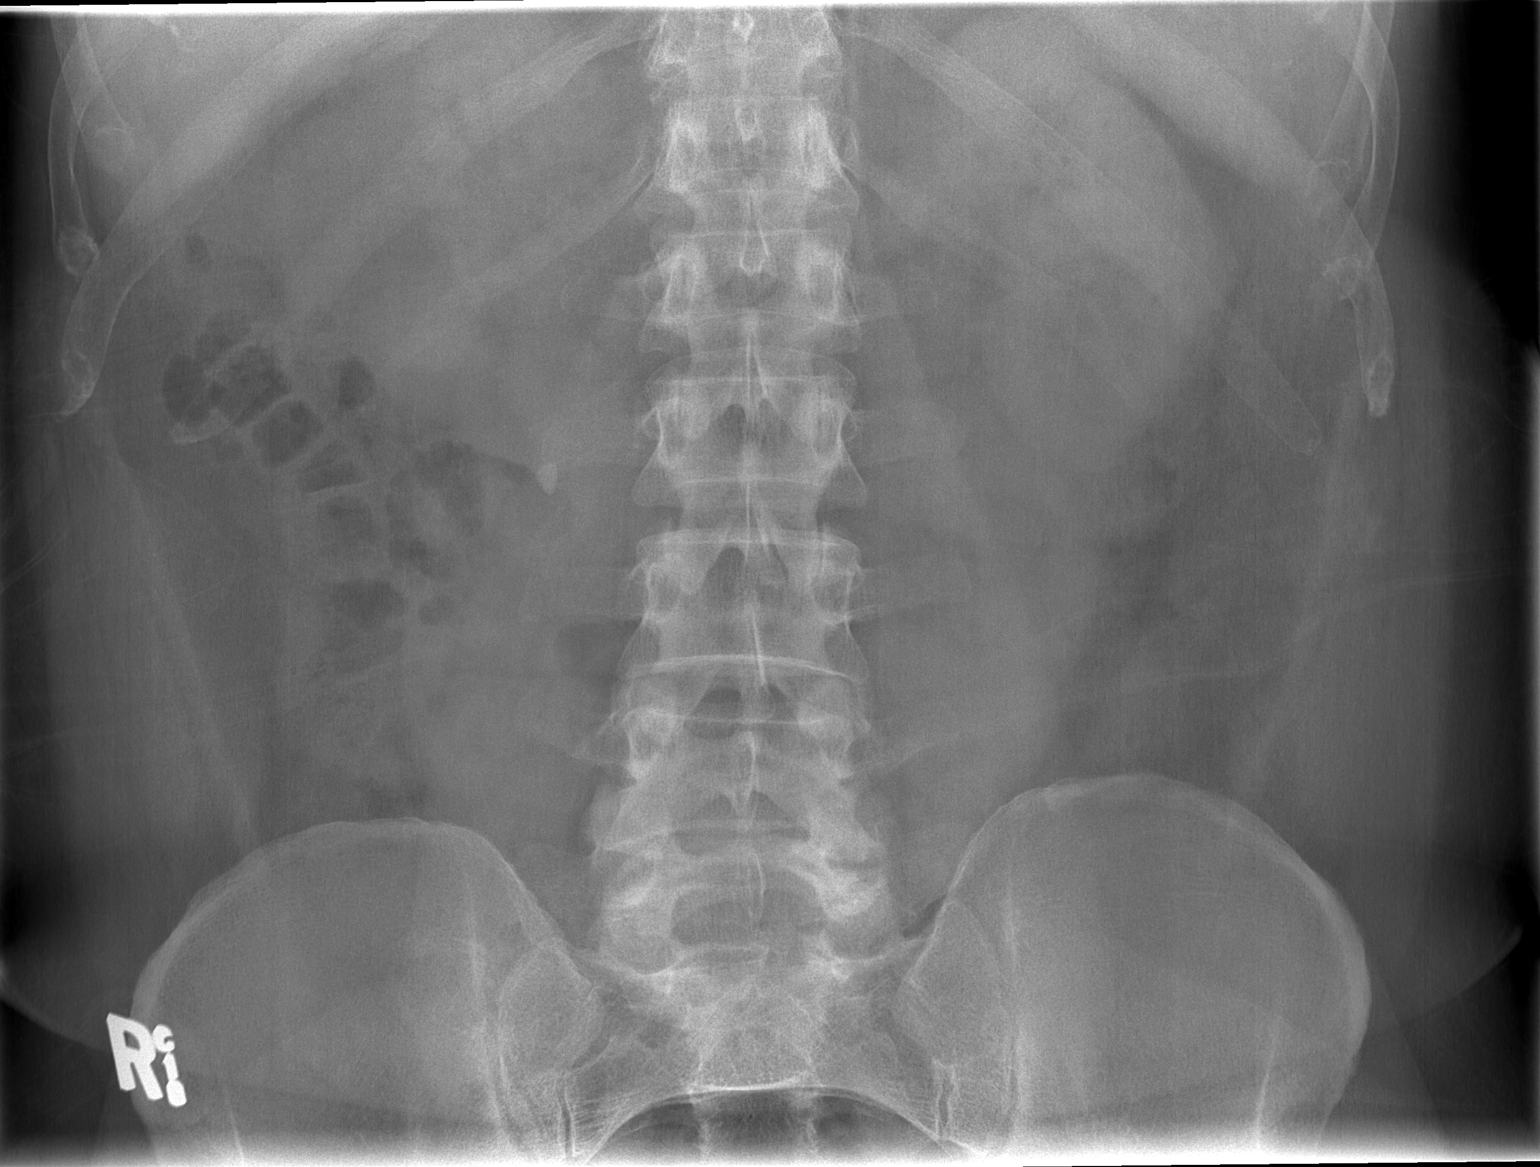

[2 of 2 positions shown; findings below may reference images not displayed]

FINDINGS: Stable 9 mm calculus projects over the right L2 transverse process
in the region of the right UPJ. Normal bowel gas pattern. Bilateral
pelvic phleboliths. Mild spondylitic change in the lower lumbar
spine.
IMPRESSION: Stable 9 mm right UPJ calculus.
